# Patient Record
Sex: Male | Born: 1985 | Race: White | Hispanic: No | Marital: Married | Smoking: Never smoker
Health system: Southern US, Community
[De-identification: ages and names within clinical notes are randomized; demographics above are authoritative.]

## PROBLEM LIST (undated history)

## (undated) DIAGNOSIS — Z789 Other specified health status: Secondary | ICD-10-CM

## (undated) DIAGNOSIS — M6740 Ganglion, unspecified site: Secondary | ICD-10-CM

## (undated) DIAGNOSIS — K219 Gastro-esophageal reflux disease without esophagitis: Secondary | ICD-10-CM

## (undated) HISTORY — PX: KNEE ARTHROSCOPY: SUR90

## (undated) HISTORY — PX: FOOT SURGERY: SHX648

---

## 2010-01-30 ENCOUNTER — Encounter: Payer: Self-pay | Admitting: Sports Medicine

## 2014-01-13 ENCOUNTER — Other Ambulatory Visit: Payer: Self-pay | Admitting: Physician Assistant

## 2014-01-13 ENCOUNTER — Encounter (HOSPITAL_BASED_OUTPATIENT_CLINIC_OR_DEPARTMENT_OTHER): Payer: Self-pay | Admitting: *Deleted

## 2014-01-13 NOTE — H&P (Signed)
Lucca Greggs/[facility] [address]   [address] [place], [place] [id number] [phone] A Division of [facility]  Loreta Aveaniel F. Zarielle Cea, M.D.   Robert A. Thurston HoleWainer, M.D.   Burnell BlanksW. Dan Caffrey, M.D.   Eulas PostJoshua P. Landau, M.D.   Lunette StandsAnna Voytek, M.D Jewel Baizeimothy D. Eulah PontMurphy, M.D.  Buford DresserWesley R. Ibazebo, M.D.  Estell HarpinJames S. Kramer, M.D.    Melina Fiddlerebecca S. Bassett, M.D. Mary L. Isidoro DonningAnton, PA-C  Kirstin A. Shepperson, PA-C  Josh Carrolltonhadwell, PA-C [place]andon Parry, [place]   RE: Keith HammersmithHohn, Keith                                [phone]      DOB: 06/26/1985 PROGRESS NOTE: 09-01-13 Patient is a 29 year-old male who presents today for follow up right ankle pain and swelling.  Patient was recently evaluated with an MRI to assess a ganglion cyst within the medial right ankle.  He reports continued pain and intermittent swelling, particularly distal to the medial malleolus.  Describes a dull ache.  Worse with weight bearing activities.  Describes some stiffness in the morning.  He has a history of previous surgery with right foot excision of talar bossing and fracture spurring of the talonavicular joint.   Past medical history, past surgical history, social history, family history and review of systems have been reviewed on patient's questionnaire and signed.  Review of systems: Otherwise negative.       EXAMINATION: Well-developed, well-nourished male in no acute distress.  Alert and oriented x 3.  Examination of the right foot reveals a ganglion cyst extending from above his medial malleolus and down into his navicular with tenderness along this area.  No significant swelling in the tarsal tunnel.  Some irritation with eversion, inversion and plantarflexion.  He has good range of motion.  Normal arches with no significant collapse when weight bearing.   X-RAYS: MRI revealed extensive complex fluid trapped, collection along the peroneal brevis, tibialis posterior, flexor digitorum longus, which is  significantly worse compared to previously with an abnormal amount of edema in the distal tibia and adjacent cuboids.  Edematous Type I accessory navicular.    IMPRESSION: Fairly significant septated ganglion cyst off the posterior tibialis and flexor digitorum longus of the right foot.    PLAN: Discussed with the patient that based on the MRI he has pretty complex septated ganglion cyst around his tendons.  His options are proceeding with an attempt to aspirate the largest of this ganglion cyst under ultrasound guided injection, followed by a steroid injection in this area to hopefully relieve and reabsorb some of the ganglion cyst fluid.  However, given that the ganglion cyst is complex and septated not all of the fluid will be removed and he potentially might get better results from a surgical intervention to remove this ganglion cyst.  Patient was agreeable with proceeding with a ganglion cyst draining under ultrasound guidance.  Patient will follow up based on the results of the drainage and injection.  He will let us know if he would like to proceed with surgical intervention at that time.  Patient was scheduled with coordinator to be prepared for potential surgical intervention in the future.      Continued  Ardit Danh/[facility] [address]   [address] [place], [place] [phone] A Division of [facility]  Loreta Aveaniel F. Brody Kump, M.D.   Robert A. Thurston HoleWainer, M.D.  Burnell Blanks, M.D.   Eulas Post, M.D.   Lunette Stands, M.D Jewel Baize. Eulah Pont, M.D.  Buford Dresser, M.D.  Estell Harpin, M.D.    Melina Fiddler, M.D. Mary L. Isidoro Donning, PA-C  Kirstin A. Shepperson, PA-C  Josh Letona, PA-C [place], [place]   RE: Keith Shepard, Keith Shepard                                [phone]      DOB: 1985-04-12 PROGRESS NOTE: 09-01-13 PROCEDURE NOTE: The patient's clinical condition is marked by substantial pain and/or significant functional  disability.  Other conservative therapy has not provided relief, is contraindicated, or not appropriate.  There is a reasonable likelihood that injection will significantly improve the patient's pain and/or functional disability. Patient's skin was cleaned with antiseptic.  An ultrasound guided probe was marked for proper placement.  An In-Plane needle was placed into the largest ganglion cyst at the posterior tibialis tendon.  Approximately 0.5-0.6 cc of fluid was removed, followed by injection of 1 cc of Lidocaine and Corticosteroid mixture into the ganglion cyst.  Both were done under ultrasound guidance and verified good placement of injection.    Loreta Ave, M.D.   Electronically verified by Loreta Ave, M.D. DFM(DMD):jjh D 09-01-13 T 09-02-13   01/13/14.Marland KitchenMarland KitchenMarland KitchenPatient seen and examined. H&P unchanged. Proceed with excision of right ankle ganglion.Marland KitchenMarland KitchenLoreta Ave MD

## 2014-01-14 ENCOUNTER — Encounter (HOSPITAL_BASED_OUTPATIENT_CLINIC_OR_DEPARTMENT_OTHER)
Admission: RE | Disposition: A | Discharge status: Home / Self Care | Payer: Self-pay | Source: Ambulatory Visit | Attending: Orthopedic Surgery

## 2014-01-14 ENCOUNTER — Ambulatory Visit (HOSPITAL_BASED_OUTPATIENT_CLINIC_OR_DEPARTMENT_OTHER): Payer: Managed Care, Other (non HMO) | Admitting: Anesthesiology

## 2014-01-14 ENCOUNTER — Ambulatory Visit (HOSPITAL_BASED_OUTPATIENT_CLINIC_OR_DEPARTMENT_OTHER)
Admission: RE | Admit: 2014-01-14 | Discharge: 2014-01-14 | Disposition: A | Discharge status: Home / Self Care | Payer: Managed Care, Other (non HMO) | Source: Ambulatory Visit | Attending: Orthopedic Surgery | Admitting: Orthopedic Surgery

## 2014-01-14 ENCOUNTER — Encounter (HOSPITAL_BASED_OUTPATIENT_CLINIC_OR_DEPARTMENT_OTHER): Payer: Self-pay | Admitting: Anesthesiology

## 2014-01-14 DIAGNOSIS — M67471 Ganglion, right ankle and foot: Secondary | ICD-10-CM | POA: Diagnosis not present

## 2014-01-14 DIAGNOSIS — M71371 Other bursal cyst, right ankle and foot: Secondary | ICD-10-CM | POA: Insufficient documentation

## 2014-01-14 DIAGNOSIS — K219 Gastro-esophageal reflux disease without esophagitis: Secondary | ICD-10-CM | POA: Insufficient documentation

## 2014-01-14 HISTORY — DX: Ganglion, unspecified site: M67.40

## 2014-01-14 HISTORY — DX: Gastro-esophageal reflux disease without esophagitis: K21.9

## 2014-01-14 HISTORY — PX: SYNOVECTOMY: SHX5180

## 2014-01-14 HISTORY — DX: Other specified health status: Z78.9

## 2014-01-14 HISTORY — PX: EAR CYST EXCISION: SHX22

## 2014-01-14 SURGERY — CYST REMOVAL
Anesthesia: General | Site: Ankle | Laterality: Right

## 2014-01-14 MED ORDER — SUFENTANIL CITRATE 50 MCG/ML IV SOLN
INTRAVENOUS | Status: DC | PRN
  Administered 2014-01-14 (×2): 5 ug via INTRAVENOUS
  Administered 2014-01-14 (×2): 10 ug via INTRAVENOUS

## 2014-01-14 MED ORDER — 0.9 % SODIUM CHLORIDE (POUR BTL) OPTIME
TOPICAL | Status: DC | PRN
  Administered 2014-01-14: 500 mL

## 2014-01-14 MED ORDER — SUFENTANIL CITRATE 50 MCG/ML IV SOLN
INTRAVENOUS | Status: AC
  Filled 2014-01-14: qty 1

## 2014-01-14 MED ORDER — CEFAZOLIN SODIUM-DEXTROSE 2-3 GM-% IV SOLR
INTRAVENOUS | Status: AC
  Filled 2014-01-14: qty 50

## 2014-01-14 MED ORDER — LACTATED RINGERS IV SOLN: INTRAVENOUS | Status: DC

## 2014-01-14 MED ORDER — ONDANSETRON HCL 4 MG PO TABS: 4.0000 mg | ORAL_TABLET | Freq: Three times a day (TID) | ORAL | Status: DC | PRN

## 2014-01-14 MED ORDER — OXYCODONE-ACETAMINOPHEN 5-325 MG PO TABS: 1.0000 | ORAL_TABLET | ORAL | Status: DC | PRN

## 2014-01-14 MED ORDER — PROPOFOL 10 MG/ML IV BOLUS
INTRAVENOUS | Status: DC | PRN
  Administered 2014-01-14: 300 mg via INTRAVENOUS

## 2014-01-14 MED ORDER — MIDAZOLAM HCL 2 MG/2ML IJ SOLN: 1.0000 mg | INTRAMUSCULAR | Status: DC | PRN

## 2014-01-14 MED ORDER — BUPIVACAINE HCL (PF) 0.5 % IJ SOLN
INTRAMUSCULAR | Status: DC | PRN
  Administered 2014-01-14: 7 mL

## 2014-01-14 MED ORDER — OXYCODONE HCL 5 MG PO TABS
5.0000 mg | ORAL_TABLET | Freq: Once | ORAL | Status: AC | PRN
  Administered 2014-01-14: 5 mg via ORAL

## 2014-01-14 MED ORDER — MIDAZOLAM HCL 5 MG/5ML IJ SOLN
INTRAMUSCULAR | Status: DC | PRN
  Administered 2014-01-14: 2 mg via INTRAVENOUS

## 2014-01-14 MED ORDER — PROPOFOL 10 MG/ML IV EMUL
INTRAVENOUS | Status: AC
  Filled 2014-01-14: qty 100

## 2014-01-14 MED ORDER — HYDROMORPHONE HCL 1 MG/ML IJ SOLN
0.2500 mg | INTRAMUSCULAR | Status: DC | PRN
  Administered 2014-01-14 (×2): 0.5 mg via INTRAVENOUS

## 2014-01-14 MED ORDER — CHLORHEXIDINE GLUCONATE 4 % EX LIQD: 60.0000 mL | Freq: Once | CUTANEOUS | Status: DC

## 2014-01-14 MED ORDER — MIDAZOLAM HCL 2 MG/2ML IJ SOLN
INTRAMUSCULAR | Status: AC
  Filled 2014-01-14: qty 2

## 2014-01-14 MED ORDER — CEFAZOLIN SODIUM-DEXTROSE 2-3 GM-% IV SOLR
2.0000 g | INTRAVENOUS | Status: AC
  Administered 2014-01-14: 2 g via INTRAVENOUS

## 2014-01-14 MED ORDER — LACTATED RINGERS IV SOLN
INTRAVENOUS | Status: DC
  Administered 2014-01-14 (×2): via INTRAVENOUS

## 2014-01-14 MED ORDER — OXYCODONE HCL 5 MG PO TABS
ORAL_TABLET | ORAL | Status: AC
  Filled 2014-01-14: qty 1

## 2014-01-14 MED ORDER — HYDROMORPHONE HCL 1 MG/ML IJ SOLN
INTRAMUSCULAR | Status: AC
  Filled 2014-01-14: qty 1

## 2014-01-14 MED ORDER — FENTANYL CITRATE 0.05 MG/ML IJ SOLN: 50.0000 ug | INTRAMUSCULAR | Status: DC | PRN

## 2014-01-14 MED ORDER — ONDANSETRON HCL 4 MG/2ML IJ SOLN: 4.0000 mg | Freq: Once | INTRAMUSCULAR | Status: DC | PRN

## 2014-01-14 MED ORDER — OXYCODONE HCL 5 MG/5ML PO SOLN: 5.0000 mg | Freq: Once | ORAL | Status: AC | PRN

## 2014-01-14 MED ORDER — LIDOCAINE HCL (CARDIAC) 20 MG/ML IV SOLN
INTRAVENOUS | Status: DC | PRN
  Administered 2014-01-14: 50 mg via INTRAVENOUS

## 2014-01-14 MED ORDER — DEXAMETHASONE SODIUM PHOSPHATE 10 MG/ML IJ SOLN
INTRAMUSCULAR | Status: DC | PRN
  Administered 2014-01-14: 10 mg via INTRAVENOUS

## 2014-01-14 SURGICAL SUPPLY — 60 items
BANDAGE ELASTIC 3 VELCRO ST LF (GAUZE/BANDAGES/DRESSINGS) IMPLANT
BANDAGE ELASTIC 4 VELCRO ST LF (GAUZE/BANDAGES/DRESSINGS) ×2 IMPLANT
BANDAGE ELASTIC 6 VELCRO ST LF (GAUZE/BANDAGES/DRESSINGS) ×2 IMPLANT
BLADE MINI RND TIP GREEN BEAV (BLADE) IMPLANT
BLADE SURG 15 STRL LF DISP TIS (BLADE) ×1 IMPLANT
BLADE SURG 15 STRL SS (BLADE) ×3
BNDG CMPR 9X4 STRL LF SNTH (GAUZE/BANDAGES/DRESSINGS) ×1
BNDG CMPR MD 5X2 ELC HKLP STRL (GAUZE/BANDAGES/DRESSINGS)
BNDG COHESIVE 4X5 TAN STRL (GAUZE/BANDAGES/DRESSINGS) ×3 IMPLANT
BNDG ELASTIC 2 VLCR STRL LF (GAUZE/BANDAGES/DRESSINGS) IMPLANT
BNDG ESMARK 4X9 LF (GAUZE/BANDAGES/DRESSINGS) ×2 IMPLANT
CORDS BIPOLAR (ELECTRODE) ×3 IMPLANT
COVER BACK TABLE 60X90IN (DRAPES) ×3 IMPLANT
COVER MAYO STAND STRL (DRAPES) ×3 IMPLANT
CUFF TOURNIQUET SINGLE 18IN (TOURNIQUET CUFF) IMPLANT
CUFF TOURNIQUET SINGLE 34IN LL (TOURNIQUET CUFF) ×2 IMPLANT
DECANTER SPIKE VIAL GLASS SM (MISCELLANEOUS) ×1 IMPLANT
DRAPE EXTREMITY T 121X128X90 (DRAPE) ×3 IMPLANT
DRSG PAD ABDOMINAL 8X10 ST (GAUZE/BANDAGES/DRESSINGS) IMPLANT
DURAPREP 26ML APPLICATOR (WOUND CARE) ×3 IMPLANT
ELECT REM PT RETURN 9FT ADLT (ELECTROSURGICAL) ×3
ELECTRODE REM PT RTRN 9FT ADLT (ELECTROSURGICAL) IMPLANT
GAUZE SPONGE 4X4 12PLY STRL (GAUZE/BANDAGES/DRESSINGS) ×3 IMPLANT
GAUZE XEROFORM 1X8 LF (GAUZE/BANDAGES/DRESSINGS) ×3 IMPLANT
GLOVE BIOGEL M 7.0 STRL (GLOVE) ×2 IMPLANT
GLOVE BIOGEL PI IND STRL 7.0 (GLOVE) ×1 IMPLANT
GLOVE BIOGEL PI INDICATOR 7.0 (GLOVE) ×2
GLOVE ECLIPSE 7.0 STRL STRAW (GLOVE) ×3 IMPLANT
GLOVE EXAM NITRILE MD LF STRL (GLOVE) ×2 IMPLANT
GLOVE ORTHO TXT STRL SZ7.5 (GLOVE) ×6 IMPLANT
GOWN STRL REUS W/ TWL LRG LVL3 (GOWN DISPOSABLE) ×3 IMPLANT
GOWN STRL REUS W/TWL LRG LVL3 (GOWN DISPOSABLE) ×9
GOWN STRL REUS W/TWL XL LVL3 (GOWN DISPOSABLE) ×1 IMPLANT
NDL HYPO 25X1 1.5 SAFETY (NEEDLE) ×1 IMPLANT
NEEDLE HYPO 25X1 1.5 SAFETY (NEEDLE) ×3 IMPLANT
NS IRRIG 1000ML POUR BTL (IV SOLUTION) ×3 IMPLANT
PACK BASIN DAY SURGERY FS (CUSTOM PROCEDURE TRAY) ×3 IMPLANT
PAD CAST 3X4 CTTN HI CHSV (CAST SUPPLIES) IMPLANT
PAD CAST 4YDX4 CTTN HI CHSV (CAST SUPPLIES) IMPLANT
PADDING CAST ABS 4INX4YD NS (CAST SUPPLIES)
PADDING CAST ABS COTTON 4X4 ST (CAST SUPPLIES) ×1 IMPLANT
PADDING CAST COTTON 3X4 STRL (CAST SUPPLIES)
PADDING CAST COTTON 4X4 STRL (CAST SUPPLIES) ×6
PADDING UNDERCAST 2 STRL (CAST SUPPLIES)
PADDING UNDERCAST 2X4 STRL (CAST SUPPLIES) IMPLANT
PENCIL BUTTON HOLSTER BLD 10FT (ELECTRODE) IMPLANT
SPLINT FAST PLASTER 5X30 (CAST SUPPLIES) ×40
SPLINT PLASTER CAST FAST 5X30 (CAST SUPPLIES) IMPLANT
STOCKINETTE 4X48 STRL (DRAPES) ×3 IMPLANT
STOCKINETTE 6  STRL (DRAPES) ×2
STOCKINETTE 6 STRL (DRAPES) IMPLANT
SUT ETHILON 3 0 PS 1 (SUTURE) ×5 IMPLANT
SUT VIC AB 2-0 SH 27 (SUTURE) ×6
SUT VIC AB 2-0 SH 27XBRD (SUTURE) IMPLANT
SUT VIC AB 3-0 SH 27 (SUTURE)
SUT VIC AB 3-0 SH 27X BRD (SUTURE) IMPLANT
SYR BULB 3OZ (MISCELLANEOUS) ×3 IMPLANT
SYR CONTROL 10ML LL (SYRINGE) ×3 IMPLANT
TOWEL OR 17X24 6PK STRL BLUE (TOWEL DISPOSABLE) ×3 IMPLANT
UNDERPAD 30X30 INCONTINENT (UNDERPADS AND DIAPERS) ×3 IMPLANT

## 2014-01-14 NOTE — Progress Notes (Signed)
Spoke with family and wife, explained what had occurred. Questions from family answered and family stated understanding.

## 2014-01-14 NOTE — Transfer of Care (Signed)
Immediate Anesthesia Transfer of Care Note  Patient: Madilyn HookJoshua B Beasley  Procedure(s) Performed: Procedure(s): EXCISION GANGLION CYST RIGHT ANKLE (Right) SYNOVECTOMY--EXTENSIVE TENOSYNOVECTOMY POSTERIOR TIBIAL TENDON (Right)  Patient Location: PACU  Anesthesia Type:General  Level of Consciousness: sedated and obtunded  Airway & Oxygen Therapy: Patient Spontanous Breathing and Patient connected to face mask oxygen  Post-op Assessment: Report given to PACU RN and Post -op Vital signs reviewed and stable  Post vital signs: Reviewed and stable  Complications: No apparent anesthesia complications

## 2014-01-14 NOTE — Anesthesia Postprocedure Evaluation (Signed)
  Anesthesia Post-op Note  Patient: Keith Shepard  Procedure(s) Performed: Procedure(s): EXCISION GANGLION CYST RIGHT ANKLE (Right) SYNOVECTOMY--EXTENSIVE TENOSYNOVECTOMY POSTERIOR TIBIAL TENDON (Right)  Patient Location: PACU  Anesthesia Type: General   Level of Consciousness: awake, alert  and oriented  Airway and Oxygen Therapy: Patient Spontanous Breathing  Post-op Pain: mild  Post-op Assessment: Post-op Vital signs reviewed  Post-op Vital Signs: Reviewed  Last Vitals:  Filed Vitals:   01/14/14 1445  BP: 121/71  Pulse: 52  Temp:   Resp: 14    Complications: No apparent anesthesia complications 

## 2014-01-14 NOTE — Anesthesia Preprocedure Evaluation (Signed)
Anesthesia Evaluation  Patient identified by MRN, date of birth, ID band Patient awake    Reviewed: Allergy & Precautions, NPO status , Patient's Chart, lab work & pertinent test results  Airway Mallampati: II  TM Distance: >3 FB Neck ROM: Full    Dental  (+) Teeth Intact, Dental Advisory Given   Pulmonary  breath sounds clear to auscultation        Cardiovascular Rhythm:Regular Rate:Normal     Neuro/Psych    GI/Hepatic GERD-  Medicated and Controlled,  Endo/Other    Renal/GU      Musculoskeletal   Abdominal   Peds  Hematology   Anesthesia Other Findings   Reproductive/Obstetrics                             Anesthesia Physical Anesthesia Plan  ASA: II  Anesthesia Plan: General   Post-op Pain Management:    Induction: Intravenous  Airway Management Planned: LMA  Additional Equipment:   Intra-op Plan:   Post-operative Plan: Extubation in OR  Informed Consent: I have reviewed the patients History and Physical, chart, labs and discussed the procedure including the risks, benefits and alternatives for the proposed anesthesia with the patient or authorized representative who has indicated his/her understanding and acceptance.   Dental advisory given  Plan Discussed with: CRNA, Anesthesiologist and Surgeon  Anesthesia Plan Comments:         Anesthesia Quick Evaluation  

## 2014-01-14 NOTE — Anesthesia Postprocedure Evaluation (Signed)
  Anesthesia Post-op Note  Patient: Keith HookJoshua B Milling  Procedure(s) Performed: Procedure(s): EXCISION GANGLION CYST RIGHT ANKLE (Right) SYNOVECTOMY--EXTENSIVE TENOSYNOVECTOMY POSTERIOR TIBIAL TENDON (Right)  Patient Location: PACU  Anesthesia Type: General   Level of Consciousness: awake, alert  and oriented  Airway and Oxygen Therapy: Patient Spontanous Breathing  Post-op Pain: mild  Post-op Assessment: Post-op Vital signs reviewed  Post-op Vital Signs: Reviewed  Last Vitals:  Filed Vitals:   01/14/14 1445  BP: 121/71  Pulse: 52  Temp:   Resp: 14    Complications: No apparent anesthesia complications

## 2014-01-14 NOTE — Discharge Instructions (Signed)
Excision Ganglion Cyst From Ankle  Non-weightbearing until follow up appointment.  Do not remove splint.  May shower but do not let splint get wet.  May apply ice for up to 20 minutes at a time for pain and swelling.  Follow up appointment in one week.   Post Anesthesia Home Care Instructions  Activity: Get plenty of rest for the remainder of the day. A responsible adult should stay with you for 24 hours following the procedure.  For the next 24 hours, DO NOT: -Drive a car -Advertising copywriterperate machinery -Drink alcoholic beverages -Take any medication unless instructed by your physician -Make any legal decisions or sign important papers.  Meals: Start with liquid foods such as gelatin or soup. Progress to regular foods as tolerated. Avoid greasy, spicy, heavy foods. If nausea and/or vomiting occur, drink only clear liquids until the nausea and/or vomiting subsides. Call your physician if vomiting continues.  Special Instructions/Symptoms: Your throat may feel dry or sore from the anesthesia or the breathing tube placed in your throat during surgery. If this causes discomfort, gargle with warm salt water. The discomfort should disappear within 24 hours.

## 2014-01-14 NOTE — Interval H&P Note (Signed)
History and Physical Interval Note:  01/14/2014 7:32 AM  Keith Shepard  has presented today for surgery, with the diagnosis of RIGHT ANKLE GANGLION CYST  The various methods of treatment have been discussed with the patient and family. After consideration of risks, benefits and other options for treatment, the patient has consented to  Procedure(s): EXCISION GANGLION CYST RIGHT ANKLE (Right) as a surgical intervention .  The patient's history has been reviewed, patient examined, no change in status, stable for surgery.  I have reviewed the patient's chart and labs.  Questions were answered to the patient's satisfaction.     Zoella Roberti F

## 2014-01-14 NOTE — Anesthesia Procedure Notes (Addendum)
Procedure Name: LMA Insertion Date/Time: 01/14/2014 11:27 AM Performed by: Zenia ResidesPAYNE, Nemiah Bubar D Pre-anesthesia Checklist: Patient identified, Emergency Drugs available, Suction available and Patient being monitored Patient Re-evaluated:Patient Re-evaluated prior to inductionOxygen Delivery Method: Circle System Utilized Preoxygenation: Pre-oxygenation with 100% oxygen Intubation Type: IV induction Ventilation: Mask ventilation without difficulty LMA: LMA inserted LMA Size: 5.0 Number of attempts: 1 Airway Equipment and Method: bite block Placement Confirmation: positive ETCO2 Tube secured with: Tape Dental Injury: Teeth and Oropharynx as per pre-operative assessment

## 2014-01-14 NOTE — Progress Notes (Signed)
Glycopyrrolate 0.2mg  iv given by linda crna

## 2014-01-15 ENCOUNTER — Encounter (HOSPITAL_BASED_OUTPATIENT_CLINIC_OR_DEPARTMENT_OTHER): Payer: Self-pay | Admitting: Orthopedic Surgery

## 2014-01-15 NOTE — Op Note (Signed)
NAMEmilio Aspen:  HUAN, Terance                 ACCOUNT NO.:  [id number]  MEDICAL RECORD NO.:  [id number]  LOCATION:                                 FACILITY:  PHYSICIAN:  Loreta Aveaniel F. Aysia Lowder, M.D. DATE OF BIRTH:  15-Jan-1985  DATE OF PROCEDURE:  01/14/2014 DATE OF DISCHARGE:  01/14/2014                              OPERATIVE REPORT   PREOPERATIVE DIAGNOSIS:  Ganglion cyst, medial aspect, right ankle.  POSTOPERATIVE DIAGNOSES:  Ganglion cyst, medial aspect, right ankle, but also with extensive tenosynovitis throughout the posterior tibia and common flexor tendon sheaths extending from well above to well below the medial aspect of the ankle.  PROCEDURE:  Exploration of medial ankle, right.  Excision of ganglion followed by extensive tenosynovectomy of both the posterior tibia as well as the common flexor tendon sheath.  SURGEON:  Loreta Aveaniel F. Andrienne Havener, M.D.  ASSISTANT:  Odelia GageLindsey Anton, PA, present throughout the entire case and necessary for timely completion of procedure.  ANESTHESIA:  General.  BLOOD LOSS:  Minimal.  SPECIMEN EXCISED:  Tenosynovial tissue was sent as a specimen.  CULTURES:  None.  COMPLICATIONS:  None.  DRESSINGS:  Soft compressive bulky dressing and splint.  TOURNIQUET TIME:  45 minutes.  DESCRIPTION OF PROCEDURE:  The patient was brought to the operating room, placed on the operating table in a supine position.  After adequate anesthesia had been obtained, a thigh tourniquet was applied. Prepped and draped in usual sterile fashion.  Exsanguinated with elevation of Esmarch.  Tourniquet inflated to 350 mmHg.  A longitudinal incision over the tarsal tunnel area of fusiform swelling.  I started small, but this was extended proximally and distally once the lesion was identified.  There was an isolated ganglion cyst over the tendon sheaths, which was excised.  Below this, however, there was extensive fusiform swelling of the entire tendon sheath going almost all the way down  the navicular distally and well above the ankle proximally.  Both tendon sheaths were open.  I did an extensive tenosynovectomy of both of these compartments and all of this tissue was sent for specimen.  The neurovascular structure was protected and not involved on the deep side below this.  It did not seem to involve the flexion to the great toe either.  Both tendons had marked inflammation throughout, but were functional and intact.  The retinaculum between the 2 was partially excised along with debridement.  No evidence of subluxation of posterior tibial tendon when this was complete.  The wound was thoroughly irrigated.  Closed with Vicryl and nylon.  Margins were injected with Marcaine.  Sterile compressive dressing and short-leg splint applied.  Anesthesia reversed.  Brought to the recovery room. Tolerated the surgery well with no complications.     Loreta Aveaniel F. Donzel Romack, M.D.     DFM/MEDQ  D:  01/14/2014  T:  01/15/2014  Job:  [id number]

## 2014-02-26 ENCOUNTER — Other Ambulatory Visit: Payer: Self-pay | Admitting: Orthopedic Surgery

## 2014-02-26 DIAGNOSIS — R0789 Other chest pain: Secondary | ICD-10-CM

## 2014-03-03 ENCOUNTER — Ambulatory Visit
Admission: RE | Admit: 2014-03-03 | Discharge: 2014-03-03 | Disposition: A | Discharge status: Home / Self Care | Payer: Managed Care, Other (non HMO) | Source: Ambulatory Visit | Attending: Orthopedic Surgery | Admitting: Orthopedic Surgery

## 2014-03-03 DIAGNOSIS — R0789 Other chest pain: Secondary | ICD-10-CM

## 2014-03-03 MED ORDER — IOHEXOL 180 MG/ML  SOLN
1.0000 mL | Freq: Once | INTRAMUSCULAR | Status: AC | PRN
  Administered 2014-03-03: 1 mL via INTRA_ARTICULAR

## 2014-03-03 MED ORDER — TRIAMCINOLONE ACETONIDE 40 MG/ML IJ SUSP (RADIOLOGY)
40.0000 mg | Freq: Once | INTRAMUSCULAR | Status: AC
  Administered 2014-03-03: 40 mg via INTRA_ARTICULAR

## 2014-03-08 ENCOUNTER — Telehealth: Payer: Self-pay | Admitting: Radiology

## 2014-03-08 NOTE — Telephone Encounter (Signed)
Pt called about injection and wanted to know if he might need a second injection if all the pain did not go away. Explained he could call his physician in a week to 10 days and let him know how he is doing. The physician will then decided if another injection is warranted.

## 2014-03-10 ENCOUNTER — Ambulatory Visit: Payer: Managed Care, Other (non HMO) | Admitting: Cardiovascular Disease

## 2014-03-31 ENCOUNTER — Ambulatory Visit: Payer: Managed Care, Other (non HMO) | Admitting: Cardiology

## 2015-04-25 ENCOUNTER — Ambulatory Visit: Payer: Self-pay

## 2015-04-25 ENCOUNTER — Ambulatory Visit (INDEPENDENT_AMBULATORY_CARE_PROVIDER_SITE_OTHER): Payer: Managed Care, Other (non HMO) | Admitting: Podiatry

## 2015-04-25 ENCOUNTER — Ambulatory Visit (INDEPENDENT_AMBULATORY_CARE_PROVIDER_SITE_OTHER): Payer: Managed Care, Other (non HMO)

## 2015-04-25 ENCOUNTER — Encounter: Payer: Self-pay | Admitting: Podiatry

## 2015-04-25 VITALS — BP 125/82 | HR 59 | Resp 16 | Ht 73.0 in | Wt 195.0 lb

## 2015-04-25 DIAGNOSIS — M722 Plantar fascial fibromatosis: Secondary | ICD-10-CM | POA: Diagnosis not present

## 2015-04-25 DIAGNOSIS — M79671 Pain in right foot: Secondary | ICD-10-CM

## 2015-04-25 DIAGNOSIS — M205X1 Other deformities of toe(s) (acquired), right foot: Secondary | ICD-10-CM | POA: Diagnosis not present

## 2015-04-25 DIAGNOSIS — M779 Enthesopathy, unspecified: Secondary | ICD-10-CM

## 2015-04-25 MED ORDER — TRIAMCINOLONE ACETONIDE 10 MG/ML IJ SUSP
10.0000 mg | Freq: Once | INTRAMUSCULAR | Status: AC
  Administered 2015-04-25: 10 mg

## 2015-04-25 NOTE — Progress Notes (Signed)
   Subjective:    Patient ID: Keith Shepard, male    DOB: 02/28/1985, 30 y.o.   MRN: [id number]  HPI Patient has foot pain in their right foot. Pt stated, "Had surgery on right foot for ganglion cyst in Jan. 2016; foot is still painful".  Pt went to Dr. Eulah PontMurphy did surgery from [facility]. Had bone spur on right foot in 2011 and a ganglion cyst in Jan. 2016.   Review of Systems  Musculoskeletal: Positive for gait problem.  All other systems reviewed and are negative.      Objective:   Physical Exam        Assessment & Plan:

## 2015-04-27 NOTE — Progress Notes (Signed)
Subjective:     Patient ID: Keith HookJoshua B Shepard, male   DOB: 07/06/1985, 30 y.o.   MRN: [id number]  HPI patient states that a number of years he had a spur removed from his right ankle and then he had a ganglionic within his tendon of his ankle that was operated on. He's been having trouble ever since with his big toe and now has developed pain in the outside of his right foot   Review of Systems  All other systems reviewed and are negative.      Objective:   Physical Exam  Constitutional: He is oriented to person, place, and time.  Cardiovascular: Intact distal pulses.   Musculoskeletal: Normal range of motion.  Neurological: He is oriented to person, place, and time.  Skin: Skin is warm.  Nursing note and vitals reviewed.  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 Also has a ski jump appearance to the left hallux secondary to probable functional hallux limitus. Has large scar on the right ankle but has negative Tinel's sign or pain when palpated     Assessment:     Very difficult to ascertain all of his problems but it appears that the vast majority may be related to a functional hallux limitus condition and compensation in his peroneal tendinitis    Plan:     H&P and conditions reviewed with patient. Today I did a peroneal injection right 3 mg Kenalog 5 mg Xylocaine and scanned for a custom orthotic with a Morton's extension first hallux bilateral to reduce stress. Ultimately may require shortening osteotomy or other procedures but that is not yet to be decided as were hoping he response to conservative treatment  X-ray report indicates  elongated first metatarsal segment bilateral with some distal dislocation of the interphalangeal joint hallux bilateral with no indications of problems with the right peroneal insertion

## 2015-04-29 ENCOUNTER — Ambulatory Visit: Payer: 59 | Admitting: Podiatry

## 2015-05-03 ENCOUNTER — Ambulatory Visit: Payer: 59 | Admitting: Podiatry

## 2015-05-18 ENCOUNTER — Encounter: Payer: Self-pay | Admitting: Podiatry

## 2015-05-18 ENCOUNTER — Ambulatory Visit: Payer: Self-pay

## 2015-05-18 ENCOUNTER — Ambulatory Visit (INDEPENDENT_AMBULATORY_CARE_PROVIDER_SITE_OTHER): Payer: Managed Care, Other (non HMO) | Admitting: Podiatry

## 2015-05-18 DIAGNOSIS — M722 Plantar fascial fibromatosis: Secondary | ICD-10-CM | POA: Diagnosis not present

## 2015-05-18 DIAGNOSIS — M79672 Pain in left foot: Secondary | ICD-10-CM

## 2015-05-18 MED ORDER — TRIAMCINOLONE ACETONIDE 10 MG/ML IJ SUSP
10.0000 mg | Freq: Once | INTRAMUSCULAR | Status: AC
  Administered 2015-05-18: 10 mg

## 2015-05-18 NOTE — Patient Instructions (Signed)

## 2015-05-18 NOTE — Progress Notes (Signed)
Subjective:     Patient ID: Keith HookJoshua B Hart, male   DOB: 03/08/1985, 30 y.o.   MRN: [id number]  HPI patient states I'm feeling quite a bit better on the outside of my right foot but my left heel has started to hurt and I'm excited to get the orthotics   Review of Systems     Objective:   Physical Exam Neurovascular status intact with diminishment of discomfort lateral side right foot with hallux limitus deformity right over left and pain in the left plantar fashion    Assessment:     Improve tendinitis right with plantar fasciitis left    Plan:     Injected the left plantar fascia 3 mg Kenalog 5 mg Xylocaine and dispensed orthotics with all instructions on usage today

## 2015-06-17 ENCOUNTER — Ambulatory Visit: Payer: Managed Care, Other (non HMO) | Admitting: Podiatry

## 2015-10-03 ENCOUNTER — Other Ambulatory Visit: Payer: Self-pay | Admitting: *Deleted

## 2015-10-03 DIAGNOSIS — G5751 Tarsal tunnel syndrome, right lower limb: Secondary | ICD-10-CM

## 2015-10-25 ENCOUNTER — Ambulatory Visit (INDEPENDENT_AMBULATORY_CARE_PROVIDER_SITE_OTHER): Payer: 59 | Admitting: Neurology

## 2015-10-25 DIAGNOSIS — R202 Paresthesia of skin: Secondary | ICD-10-CM

## 2015-10-25 DIAGNOSIS — M79671 Pain in right foot: Secondary | ICD-10-CM | POA: Diagnosis not present

## 2015-10-25 NOTE — Procedures (Signed)
[facility]  [address] [place], [address]  [place], [place] [address] Tel: [phone] Fax:  [phone] Test Date:  10/25/2015  Patient: Keith Shepard DOB: Oct 27, 1985 Physician: Nita Sickle, DO  Sex: Male Height: 6\' 1"  Ref Phys: Dr Eulah Pont  ID#: [id number] Temp: 32.1C Technician: Judie Petit. Dean   Patient Complaints: This is a 30 year old gentleman referred for evaluation of right foot pain, specifically to evaluate for tarsal tunnel syndrome.  He has a history of right ankle ganglion cyst excision and extensive tenosynovectomy of both the posterior tibia as well as the common flexor tendon sheaths in 2016.  NCV & EMG Findings: Extensive electrodiagnostic testing of the right lower extremity an additional studies of the left shows: 1. Right sural and superficial peroneal sensory responses are within normal limits. Medial and lateral plantar sensory responses are absent bilaterally, which is most likely technical in nature. 2. Right peroneal and tibial motor responses are within normal limits. 3. There is no evidence of active or chronic motor axon loss changes affecting any of the tested muscles, including adductor hallucis and abductor digiti quinti minimi muscles. Motor unit configuration and recruitment pattern is within normal limits.  Impression: This is a normal study of the right lower extremity. In particular, there is no evidence of tarsal tunnel syndrome, sensorimotor polyneuropathy, or lumbosacral radiculopathy.   ___________________________ Nita Sickle, DO    Nerve Conduction Studies Anti Sensory Summary Table   Site NR Peak (ms) Norm Peak (ms) P-T Amp (V) Norm P-T Amp  Right Sup Peroneal Anti Sensory (Ant Lat Mall)  32.1C  12 cm    4.0 <4.4 7.8 >6  Right Sural Anti Sensory (Lat Mall)  32.1C  Calf    3.8 <4.4 14.3 >6    Motor Summary Table   Site NR Onset (ms) Norm Onset (ms) O-P Amp (mV) Norm O-P Amp Site1 Site2 Delta-0 (ms) Dist (cm) Vel (m/s) Norm Vel  (m/s)  Right Peroneal Motor (Ext Dig Brev)  32.1C  Ankle    3.5 <5.5 5.8 >3 B Fib Ankle 7.3 37.0 51 >41  B Fib    10.8  5.2  Poplt B Fib 1.9 10.0 53 >41  Poplt    12.7  5.2         Right Tibial Motor (Abd Hall Brev)  32.1C  Ankle    3.7 <5.8 8.6 >8 Knee Ankle 9.0 41.0 46 >41  Knee    12.7  8.3          Mixed Summary Table   Site NR Peak (ms) Norm Peak (ms) P-T Amp (V) Norm P-T Amp  Left Lateral Plantar Mixed (Med Malleolus)  32.6C  Lateral Foot NR  <3.7  >8  Right Lateral Plantar Mixed (Med Malleolus)  32.6C  Lateral Foot NR  <3.7  >8  Left Medial Plantar Mixed (Med Malleolus)  32.6C  Medial Foot NR  <3.7  >8  Right Medial Plantar Mixed (Med Malleolus)  32.6C  Medial Foot NR  <3.7  >8   H Reflex Studies   NR H-Lat (ms) Lat Norm (ms) L-R H-Lat (ms)  Right Tibial (Gastroc)  32.1C     34.56 <35    EMG   Side Muscle Ins Act Fibs Psw Fasc Number Recrt Dur Dur. Amp Amp. Poly Poly. Comment  Right AntTibialis Nml Nml Nml Nml Nml Nml Nml Nml Nml Nml Nml Nml N/A  Right Gastroc Nml Nml Nml Nml Nml Nml Nml Nml Nml Nml Nml Nml N/A  Right Flex  Dig Long Nml Nml Nml Nml Nml Nml Nml Nml Nml Nml Nml Nml N/A  Right AbdHallucis Nml Nml Nml Nml Nml Nml Nml Nml Nml Nml Nml Nml N/A  Right RectFemoris Nml Nml Nml Nml Nml Nml Nml Nml Nml Nml Nml Nml N/A  Right AbdDigQuinti Nml Nml Nml Nml Nml Nml Nml Nml Nml Nml Nml Nml N/A        Waveforms:

## 2016-06-11 IMAGING — CT CT BIOPSY
2 of 4 series · 15 of 36 positions shown, 18 images · non-contrast
Comparison: none

CLINICAL DATA: Sternomanubrial osteoarthritis. Sternomanubrial
pain.

[Series 2: bx · axial · 0.39mm/px · z∈[-40,+40]mm · 12 of 85 slices shown, 15 images]
[im 5/85  mediastinal]
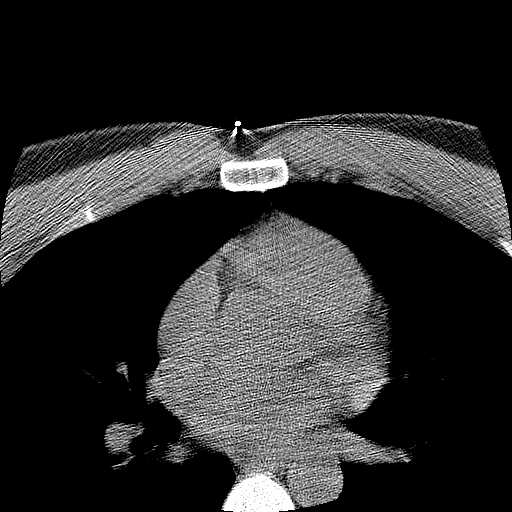
[im 5/85  lung]
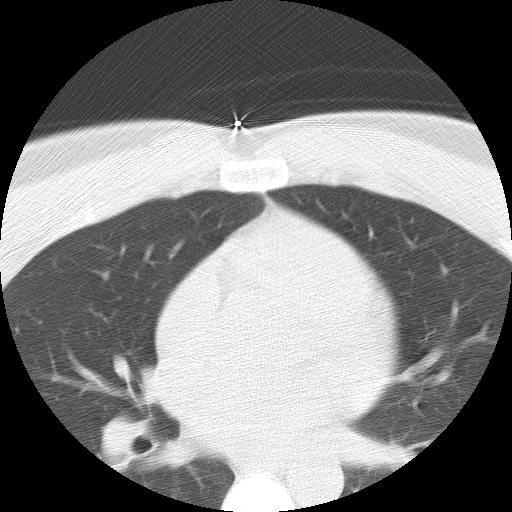
[im 13/85  lung]
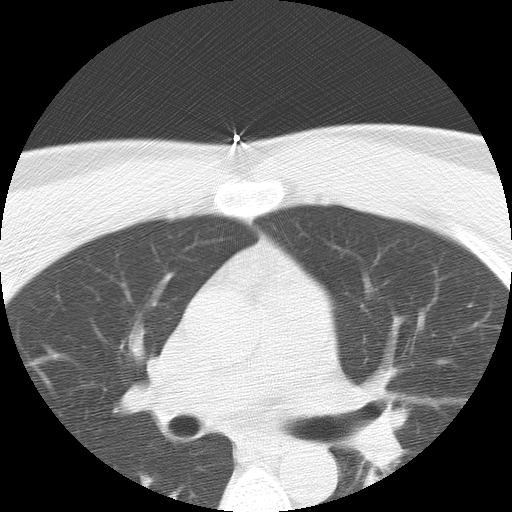
[im 21/85  lung]
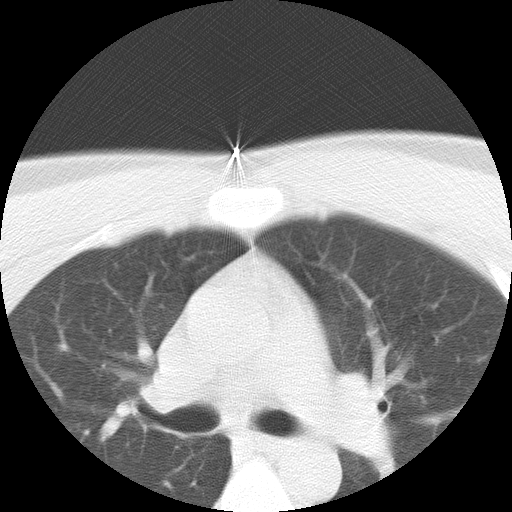
[im 25/85  lung]
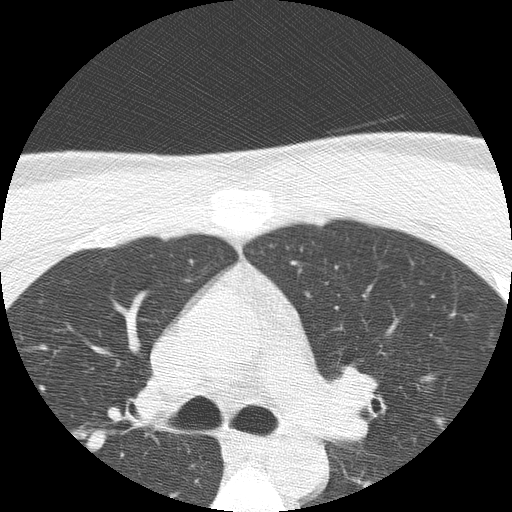
[im 33/85  mediastinal]
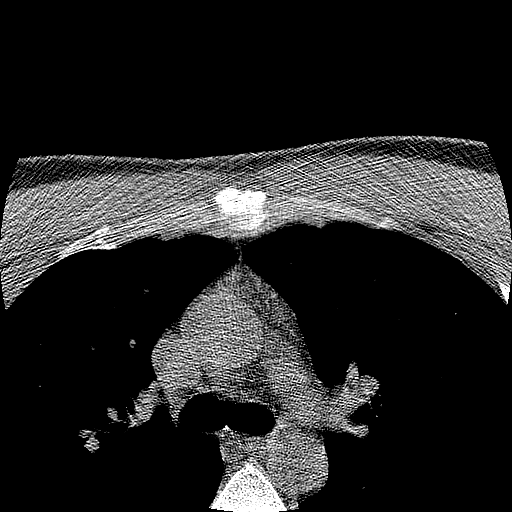
[im 33/85  lung]
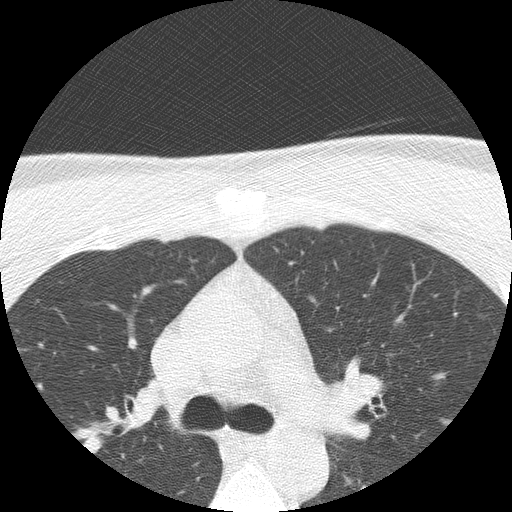
[im 41/85  lung]
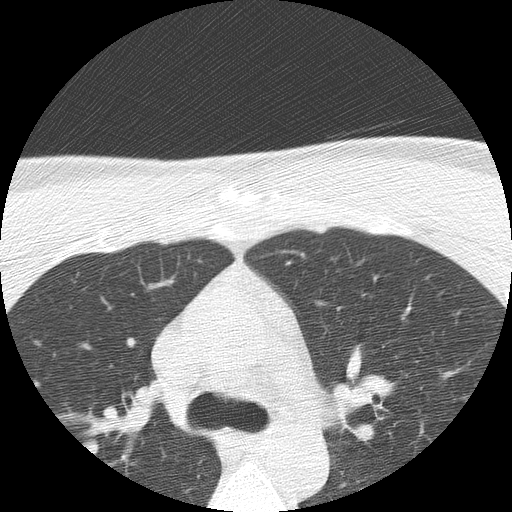
[im 45/85  lung]
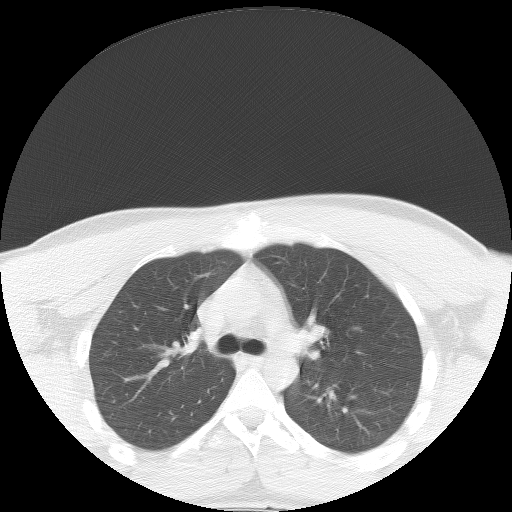
[im 53/85  lung]
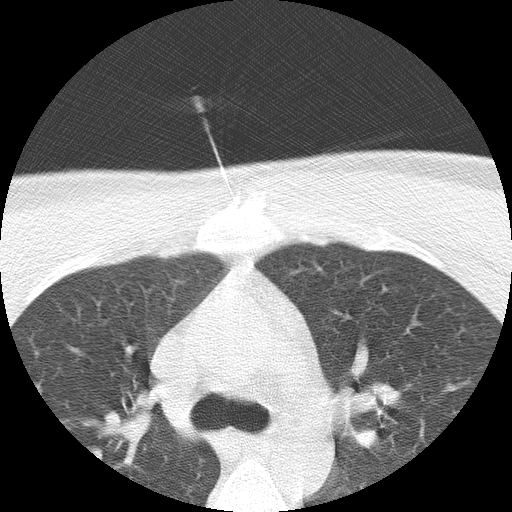
[im 61/85  mediastinal]
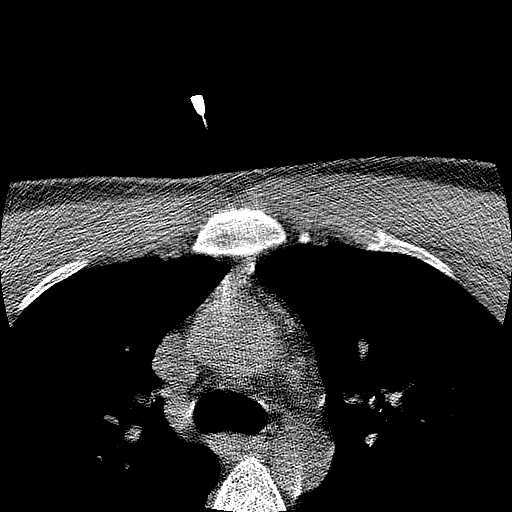
[im 61/85  lung]
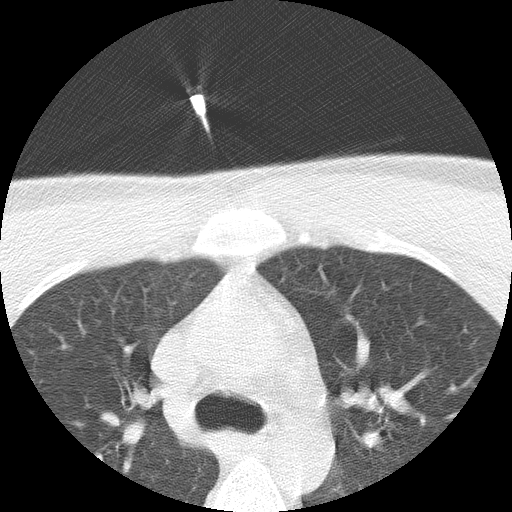
[im 65/85  lung]
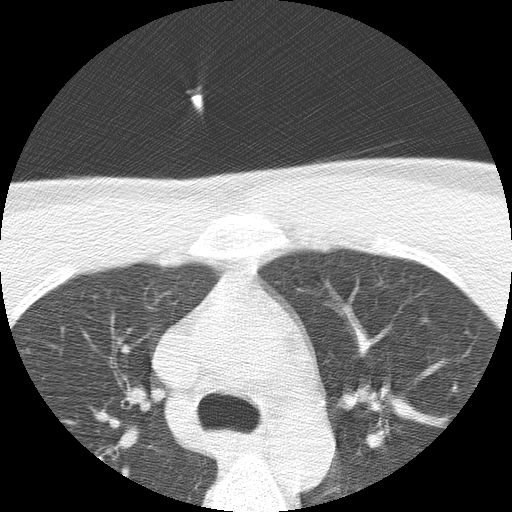
[im 73/85  lung]
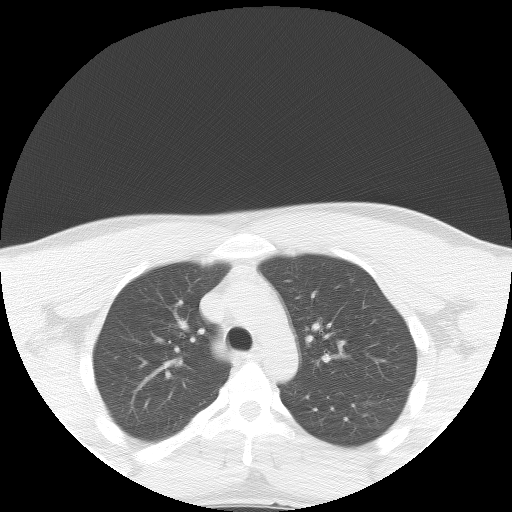
[im 81/85  lung]
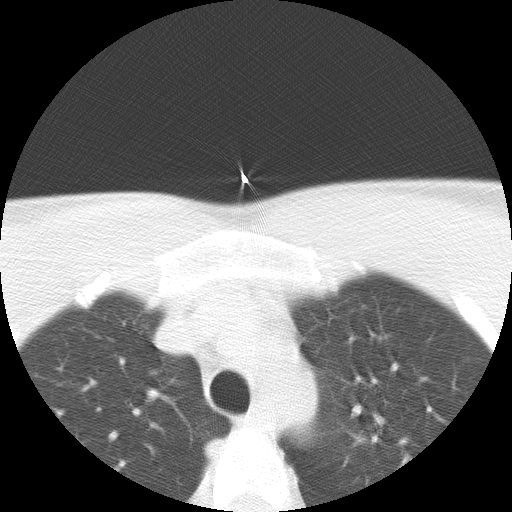

[Series 101: cor · coronal · 0.39mm/px · 3 of 31 slices shown]
[im 7/31  lung]
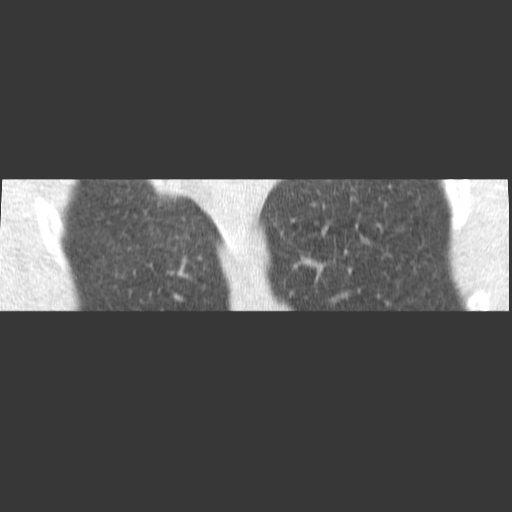
[im 13/31  lung]
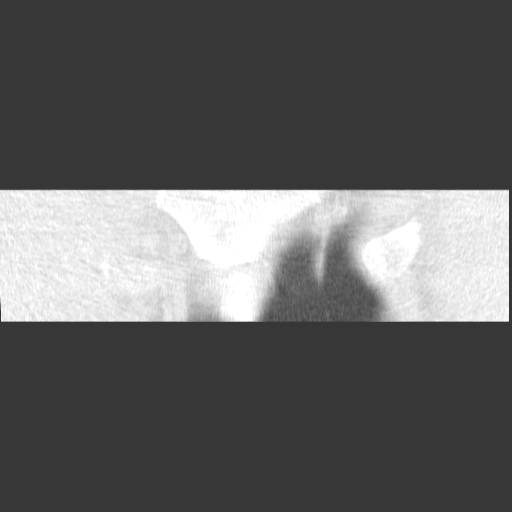
[im 19/31  lung]
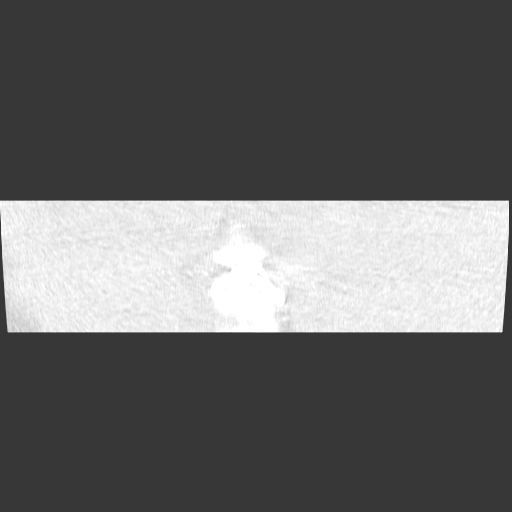

[15 of 36 positions shown; findings below may reference images not displayed]

:
PROCEDURE:

CT-guided sternomanubrial JOINT INJECTION.

After a thorough discussion of risks and benefits of the procedure,
including bleeding, infection, injury to nerves, blood vessels, and
adjacent structures, verbal and written consent was obtained.
Specific risks of the procedure included nondiagnostic /
nontherapeutic injection and non target injection. We specifically
discussed the because of the miniscule size of the sternomanubrial
joint that there would definitely be extravasation from the joint of
the injectate.

The patient was placed supine on the fluoroscopy table and
localization was performed over the sternum. Target site marked
using CT guidance. The skin was prepped and draped in the usual
sterile fashion using Betadine soap.

After local anesthesia with 1% lidocaine without epinephrine and
subsequent deep anesthesia, a 25 gauge hypodermic needle was
advanced into the sternomanubrial joint. Injection of 0.5 ml
Omnipaque 180 confirmed intra-articular placement. As expected,
contrast extravasated from the anterior aspect of the joint into the
soft tissues. Reconstructions were performed which definitively
demonstrated intra-articular placement and contrast within the
joint. No vascular uptake present. Subsequently, 40 mg of Kenalog
and 1 mL 0.5% Sensorcaine was injected into SI joint. Needles
removed and a sterile dressing applied.

Notably, concordant pain was present on and tree to the
sternomanubrial joint. This was intensified with contrast injection.
Pain was also felt at the thoracic spine. This was concordant with
every day pain.

No complications were observed. A postprocedure scan was performed
to exclude pneumothorax. No pneumothorax was present. This was
expected as the needle never passed deep to the mid sternomanubrial
joint.
IMPRESSION: Successful CT-guided sternomanubrial joint steroid and long-acting
anesthetic injection. Concordant pain at the sternomanubrial
junction and in the thoracic spine with contrast injection.

## 2021-11-01 ENCOUNTER — Ambulatory Visit (INDEPENDENT_AMBULATORY_CARE_PROVIDER_SITE_OTHER): Payer: Managed Care, Other (non HMO) | Admitting: Nurse Practitioner

## 2021-11-01 ENCOUNTER — Encounter: Payer: Self-pay | Admitting: Nurse Practitioner

## 2021-11-01 VITALS — BP 129/85 | HR 79 | Ht 72.0 in | Wt 198.0 lb

## 2021-11-01 DIAGNOSIS — F418 Other specified anxiety disorders: Secondary | ICD-10-CM

## 2021-11-01 DIAGNOSIS — Z0000 Encounter for general adult medical examination without abnormal findings: Secondary | ICD-10-CM

## 2021-11-01 DIAGNOSIS — Z021 Encounter for pre-employment examination: Secondary | ICD-10-CM | POA: Diagnosis not present

## 2021-11-01 DIAGNOSIS — Z7689 Persons encountering health services in other specified circumstances: Secondary | ICD-10-CM

## 2021-11-01 NOTE — Progress Notes (Signed)
New Patient Office Visit  Subjective    Patient ID: Keith Shepard, male    DOB: June 13, 1985  Age: 36 y.o. MRN: [id number]  CC:  Chief Complaint  Patient presents with   New Patient (Initial Visit)    HPI TRUETT MCFARLAN presents to establish care Has been several years since he has had a regular provider.  Needs to have labs done for his job. Patient is very nervous about getting this done  Has no physical concerns or complaints today   No outpatient encounter medications on file as of 11/01/2021.   No facility-administered encounter medications on file as of 11/01/2021.    Past Medical History:  Diagnosis Date   Ganglion cyst    right ankle   GERD (gastroesophageal reflux disease)    Medical history non-contributory     Past Surgical History:  Procedure Laterality Date   EAR CYST EXCISION Right 01/14/2014   Procedure: EXCISION GANGLION CYST RIGHT ANKLE;  Surgeon: Ninetta Lights, MD;  Location: [facility];  Service: Orthopedics;  Laterality: Right;   FOOT SURGERY Right    cyst removal   KNEE ARTHROSCOPY Right    SYNOVECTOMY Right 01/14/2014   Procedure: SYNOVECTOMY--EXTENSIVE TENOSYNOVECTOMY POSTERIOR TIBIAL TENDON;  Surgeon: Ninetta Lights, MD;  Location: [facility];  Service: Orthopedics;  Laterality: Right;    History reviewed. No pertinent family history.  Social History   Socioeconomic History   Marital status: Married    Spouse name: Not on file   Number of children: Not on file   Years of education: Not on file   Highest education level: Not on file  Occupational History   Not on file  Tobacco Use   Smoking status: Never   Smokeless tobacco: Not on file  Vaping Use   Vaping Use: Never used  Substance and Sexual Activity   Alcohol use: Not Currently    Comment: social   Drug use: No   Sexual activity: Yes  Other Topics Concern   Not on file  Social History Narrative   Not on file   Social Determinants of Health    Financial Resource Strain: Not on file  Food Insecurity: Not on file  Transportation Needs: Not on file  Physical Activity: Not on file  Stress: Not on file  Social Connections: Not on file  Intimate Partner Violence: Not on file    Review of Systems  Constitutional:  Negative for chills, fever and malaise/fatigue.  HENT:  Negative for congestion, sinus pain and sore throat.   Eyes: Negative.   Respiratory:  Negative for cough, shortness of breath and wheezing.   Cardiovascular:  Negative for chest pain, palpitations and leg swelling.  Gastrointestinal:  Negative for constipation, diarrhea, nausea and vomiting.  Genitourinary: Negative.   Musculoskeletal:  Negative for myalgias.  Skin: Negative.   Neurological:  Negative for dizziness and headaches.  Endo/Heme/Allergies:  Does not bruise/bleed easily.  Psychiatric/Behavioral:  Negative for depression. The patient is nervous/anxious.         Objective    Blood Pressure 129/85   Pulse 79   Height 6' (1.829 m)   Weight 198 lb (89.8 kg)   Oxygen Saturation 98%   Body Mass Index 26.85 kg/m   Physical Exam Vitals and nursing note reviewed.  Constitutional:      Appearance: Normal appearance. He is well-developed.  HENT:     Head: Normocephalic and atraumatic.     Right Ear: Tympanic membrane, ear  canal and external ear normal.     Left Ear: Tympanic membrane, ear canal and external ear normal.     Nose: Nose normal.     Mouth/Throat:     Mouth: Mucous membranes are moist.     Pharynx: Oropharynx is clear.  Eyes:     Extraocular Movements: Extraocular movements intact.     Conjunctiva/sclera: Conjunctivae normal.     Pupils: Pupils are equal, round, and reactive to light.  Cardiovascular:     Rate and Rhythm: Normal rate and regular rhythm.     Pulses: Normal pulses.     Heart sounds: Normal heart sounds.  Pulmonary:     Effort: Pulmonary effort is normal.     Breath sounds: Normal breath sounds.  Abdominal:      General: Bowel sounds are normal. There is no distension.     Palpations: Abdomen is soft. There is no mass.     Tenderness: There is no abdominal tenderness. There is no right CVA tenderness, left CVA tenderness, guarding or rebound.     Hernia: No hernia is present.  Musculoskeletal:        General: Normal range of motion.     Cervical back: Normal range of motion and neck supple.  Skin:    General: Skin is warm and dry.     Capillary Refill: Capillary refill takes less than 2 seconds.  Neurological:     General: No focal deficit present.     Mental Status: He is alert and oriented to person, place, and time.  Psychiatric:        Mood and Affect: Mood normal.        Behavior: Behavior normal.        Thought Content: Thought content normal.        Judgment: Judgment normal.     Assessment & Plan:  1. Need for history and physical examination for employment Required labs drawn during today's visit. Will notify him of results and will provide copy for him . - TSH + free T4 - Lipid panel - Comprehensive metabolic panel - CBC - Hemoglobin A1c - LEAD STANDARD PROFILE, BLOOD - Urinalysis  2. Situational anxiety Patient very anxious about having labs done. Reassurance provided. Patient tolerated lab draw well and without incident   3. Healthcare maintenance Routine, fasting labs drawn during today's visit.  - TSH + free T4 - Lipid panel - Comprehensive metabolic panel - CBC - Hemoglobin A1c - LEAD STANDARD PROFILE, BLOOD - Urinalysis  4. Encounter to establish care Appointment today to establish new primary care provider      Problem List Items Addressed This Visit       Other   Situational anxiety   Other Visit Diagnoses     Need for history and physical examination for employment    -  Primary   Relevant Orders   TSH + free T4   Lipid panel   Comprehensive metabolic panel   CBC   Hemoglobin A1c   LEAD STANDARD PROFILE, BLOOD   Urinalysis   Healthcare  maintenance       Relevant Orders   TSH + free T4   Lipid panel   Comprehensive metabolic panel   CBC   Hemoglobin A1c   LEAD STANDARD PROFILE, BLOOD   Urinalysis   Encounter to establish care           Return in about 1 year (around 11/02/2022) for health maintenance exam.   Carlean Jews, NP

## 2021-11-02 LAB — URINALYSIS
Bilirubin, UA: NEGATIVE
Glucose, UA: NEGATIVE
Ketones, UA: NEGATIVE
Leukocytes,UA: NEGATIVE
Nitrite, UA: NEGATIVE
Protein,UA: NEGATIVE
RBC, UA: NEGATIVE
Specific Gravity, UA: 1.011 (ref 1.005–1.030)
Urobilinogen, Ur: 0.2 mg/dL (ref 0.2–1.0)
pH, UA: 6.5 (ref 5.0–7.5)

## 2021-11-02 NOTE — Progress Notes (Signed)
Normal urinalysis.

## 2021-11-03 NOTE — Progress Notes (Signed)
Negative lead screening

## 2021-11-06 ENCOUNTER — Encounter: Payer: Self-pay | Admitting: Nurse Practitioner

## 2021-11-06 LAB — LEAD STANDARD PROFILE, BLOOD
Lead, Blood (Adult): 1.8 ug/dL (ref 0.0–3.4)
Zinc Protoporphyrin (ZPP): 20 ug/dL (ref 0–99)

## 2021-11-07 NOTE — Telephone Encounter (Signed)
Can you call to [facility] and see what is going on with it? That is the poor guy who was so nervous about getting his blood drawn.

## 2021-11-07 NOTE — Telephone Encounter (Signed)
Called [facility] they only have the ua that was drawn and lead

## 2021-11-07 NOTE — Telephone Encounter (Signed)
I do not know what happen

## 2021-11-07 NOTE — Telephone Encounter (Signed)
Hey. Do you know what happened with his other labs? I know they were drawn last week, but they just say collected. I'm sorry

## 2021-12-26 ENCOUNTER — Encounter: Payer: Self-pay | Admitting: Nurse Practitioner
